# Patient Record
Sex: Male | Born: 2000 | Hispanic: No | Marital: Single | Smoking: Never smoker
Health system: Southern US, Community
[De-identification: ages and names within clinical notes are randomized; demographics above are authoritative.]

## PROBLEM LIST (undated history)

## (undated) DIAGNOSIS — R519 Headache, unspecified: Secondary | ICD-10-CM

## (undated) DIAGNOSIS — F909 Attention-deficit hyperactivity disorder, unspecified type: Secondary | ICD-10-CM

## (undated) DIAGNOSIS — R011 Cardiac murmur, unspecified: Secondary | ICD-10-CM

## (undated) HISTORY — PX: TYMPANOSTOMY TUBE PLACEMENT: SHX32

---
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
## 2021-09-08 ENCOUNTER — Ambulatory Visit: Payer: Self-pay

## 2021-09-08 ENCOUNTER — Other Ambulatory Visit: Payer: Self-pay

## 2021-09-08 ENCOUNTER — Encounter: Payer: Self-pay | Admitting: Orthopaedic Surgery

## 2021-09-08 ENCOUNTER — Ambulatory Visit (INDEPENDENT_AMBULATORY_CARE_PROVIDER_SITE_OTHER): Payer: Medicaid Other | Admitting: Orthopaedic Surgery

## 2021-09-08 ENCOUNTER — Ambulatory Visit (INDEPENDENT_AMBULATORY_CARE_PROVIDER_SITE_OTHER): Payer: Medicaid Other

## 2021-09-08 ENCOUNTER — Telehealth: Payer: Self-pay | Admitting: Radiology

## 2021-09-08 VITALS — Ht 68.0 in | Wt 140.0 lb

## 2021-09-08 DIAGNOSIS — S83001A Unspecified subluxation of right patella, initial encounter: Secondary | ICD-10-CM

## 2021-09-08 DIAGNOSIS — Q741 Congenital malformation of knee: Secondary | ICD-10-CM | POA: Diagnosis not present

## 2021-09-08 DIAGNOSIS — M25561 Pain in right knee: Secondary | ICD-10-CM

## 2021-09-08 DIAGNOSIS — Z0189 Encounter for other specified special examinations: Secondary | ICD-10-CM

## 2021-09-08 NOTE — Addendum Note (Signed)
Addended by: Rogers Seeds on: 09/08/2021 04:26 PM   Modules accepted: Orders

## 2021-09-08 NOTE — Progress Notes (Signed)
Office Visit Note   Patient: Brad Arnold           Date of Birth: 09-Feb-2001           MRN: [id number] Visit Date: 09/08/2021              Requested by: [facility] [address] [place],  [place] [address] PCP: [facility]   Assessment & Plan: Visit Diagnoses:  1. Acute pain of right knee   2. Patellar subluxation, right, initial encounter   3. Bipartite patella     Plan: Patient with right knee patellar subluxation relocation.  He has positive apprehension on his opposite left knee and needs to go through quadriceps rehab program.  He can continue his knee immobilizer and use his crutches gradually wean off of this as he progresses with physical therapy.  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  Work slip given no work at [employer] firearms x4 weeks.  No drill work x4 weeks.  Recheck 4 weeks.  Follow-Up Instructions: No follow-ups on file.   Orders:  Orders Placed This Encounter  Procedures   XR KNEE 3 VIEW RIGHT   No orders of the defined types were placed in this encounter.     Procedures: No procedures performed   Clinical Data: No additional findings.   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  He was able to stand up had pain and swelling which is resolved to some degree.  He still has pain along the medial aspect of the patella and has apprehension if pressure is applied to the patella.  No past history of knee dislocation or patellar dislocation or knee subluxation.  He is currently in therapy at [facility] outpatient therapy for  right shoulder latissimus injury which is not Worker's Comp. related.  Patient was placed in a knee immobilizer and crutches and states he could not put any pressure on his knee immediately.  He has been using ice swelling is gone down and is also used Aleve which is helped.  Denies problems with opposite left knee.  Review of Systems all systems are noncontributory to HPI.   Objective: Vital Signs: Ht 5\' 8"  (1.727 m)    Wt 140 lb (63.5 kg)    BMI 21.29 kg/m   Physical Exam Constitutional:      Appearance: He is well-developed.  HENT:     Head: Normocephalic and atraumatic.     Right Ear: External ear normal.     Left Ear: External ear normal.  Eyes:     Pupils: Pupils are equal, round, and reactive to light.  Neck:     Thyroid: No thyromegaly.     Trachea: No tracheal deviation.  Cardiovascular:     Rate and Rhythm: Normal rate.  Pulmonary:     Effort: Pulmonary effort is normal.     Breath sounds: No wheezing.  Abdominal:     General: Bowel sounds are normal.     Palpations: Abdomen is soft.  Musculoskeletal:     Cervical back: Neck supple.  Skin:    General: Skin is warm and dry.     Capillary Refill: Capillary refill takes less than 2 seconds.  Neurological:     Mental Status: He is alert and oriented to person, place, and time.  Psychiatric:        Behavior: Behavior normal.        Thought Content: Thought content normal.        Judgment: Judgment normal.    Ortho Exam negative logroll the hips pulses are normal trace right knee effusion bilateral positive apprehension test with lateral patellar subluxation.  Patient is acutely tender over the right knee medial retinaculum.  Specialty Comments:  No specialty comments available.  Imaging: XR Knee 1-2 Views Left  Result Date: 09/08/2021 2 view x-ray left knee including a sunrise patella and an AP x-rays obtained for comparison to his opposite knee bipartite patella.  Left knee patella shows normal formation  normal position and alignment. Impression: Left knee normal patella.  XR KNEE 3 VIEW RIGHT  Result Date: 09/08/2021 Three-view x-rays right knee obtained and reviewed this shows bipartite patella with a smooth sclerotic junction with no acute injury.  Q angle is normal.  No acute fracture.  No knee effusion. Impression:  Right Knee Bipartite patella.  No evulsion fracture post patellar subluxation with normal medial patellar border.    PMFS History: Patient Active Problem List   Diagnosis Date Noted   Patellar subluxation, right, initial encounter 09/08/2021   Bipartite patella 09/08/2021    No past medical history on file.  Family History  Problem Relation Age of Onset   Diabetes Other    Stroke Other    Cancer Other    Hypertension Other     No past surgical history on file. Social History   Occupational History   Not on file  Tobacco Use   Smoking status: Never   Smokeless tobacco: Never  Substance and Sexual Activity   Alcohol use: No   Drug use: No   Sexual activity: Not on file

## 2021-09-08 NOTE — Telephone Encounter (Signed)
Patient's mom came in for appointment. 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   FYI

## 2021-09-08 NOTE — Telephone Encounter (Signed)
Patient is being seen in the [place] office today. 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 He was told by someone there that he would need to have [place] bill him and then he is to take the bill to them to handle. I did not know what he would need to do or if it is just filed as no insurance?  Sorry..this was out of my realm.  Could you please call him and let him know what he needs to do?

## 2021-09-14 NOTE — Telephone Encounter (Signed)
noted 

## 2021-10-06 ENCOUNTER — Other Ambulatory Visit: Payer: Self-pay

## 2021-10-06 ENCOUNTER — Ambulatory Visit (INDEPENDENT_AMBULATORY_CARE_PROVIDER_SITE_OTHER): Payer: Medicaid Other | Admitting: Orthopaedic Surgery

## 2021-10-06 ENCOUNTER — Encounter: Payer: Self-pay | Admitting: Orthopaedic Surgery

## 2021-10-06 VITALS — Ht 68.0 in | Wt 140.0 lb

## 2021-10-06 DIAGNOSIS — S83001A Unspecified subluxation of right patella, initial encounter: Secondary | ICD-10-CM | POA: Diagnosis not present

## 2021-10-06 NOTE — Progress Notes (Signed)
Office Visit Note   Patient: Brad Arnold           Date of Birth: [birth year]           MRN: [id number] Visit Date: 10/06/2021              Requested by: [facility] [address] [place],  [place] [address] PCP: [facility]   Assessment & Plan: Visit Diagnoses:  1. Patellar subluxation, right, initial encounter            Subsequent encounter.  Plan: Patient can work on straight leg raising at home with the either ankle weights that he already has or book bag or rucksack over his ankle.  We are still waiting on some therapy to start and if he does not respond to therapy then MRI scan of his knee may be needed.  Patient not ready to resume his normal job where he standing and work slip given no work x4 weeks.  He will call if he feels that he is made progress enough that he could resume work and needs an updated note.  Otherwise recheck 4 weeks.  Follow-Up Instructions: Return in about 4 weeks (around 11/03/2021).   Orders:  No orders of the defined types were placed in this encounter.  No orders of the defined types were placed in this encounter.     Procedures: No procedures performed   Clinical Data: No additional findings.   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  Patient's been in a knee immobilizer and we have been waiting on physical therapy to start.  He has been going to therapy at [facility] outpatient therapy for his shoulder which was not related to his knee injury and as a separate injury.  Patient's been in a knee immobilizer has been using crutches.  He removes it when he showers.  He has used some intermittent Aleve.  Review of Systems all other systems updated  unchanged.   Objective: Vital Signs: Ht 5\' 8"  (1.727 m)    Wt 140 lb (63.5 kg)    BMI 21.29 kg/m   Physical Exam Constitutional:      Appearance: He is well-developed.  HENT:     Head: Normocephalic and atraumatic.     Right Ear: External ear normal.     Left Ear: External ear normal.  Eyes:     Pupils: Pupils are equal, round, and reactive to light.  Neck:     Thyroid: No thyromegaly.     Trachea: No tracheal deviation.  Cardiovascular:     Rate and Rhythm: Normal rate.  Pulmonary:     Effort: Pulmonary effort is normal.     Breath sounds: No wheezing.  Abdominal:     General: Bowel sounds are normal.     Palpations: Abdomen is soft.  Musculoskeletal:     Cervical back: Neck supple.  Skin:    General: Skin is warm and dry.     Capillary Refill: Capillary refill takes less than 2 seconds.  Neurological:     Mental Status: He is alert and oriented to person, place, and time.  Psychiatric:        Behavior: Behavior normal.        Thought  Content: Thought content normal.        Judgment: Judgment normal.    Ortho Exam the swelling is down.  Apprehension with patellar subluxation.  Specialty Comments:  No specialty comments available.  Imaging: No results found.   PMFS History: Patient Active Problem List   Diagnosis Date Noted   Patellar subluxation, right, initial encounter 09/08/2021   Bipartite patella 09/08/2021   No past medical history on file.  Family History  Problem Relation Age of Onset   Diabetes Other    Stroke Other    Cancer Other    Hypertension Other     No past surgical history on file. Social History   Occupational History   Not on file  Tobacco Use   Smoking status: Never   Smokeless tobacco: Never  Substance and Sexual Activity   Alcohol use: No   Drug use: No   Sexual activity: Not on file

## 2021-10-13 ENCOUNTER — Telehealth: Payer: Self-pay | Admitting: Radiology

## 2021-10-13 NOTE — Telephone Encounter (Signed)
Received call asking if it is okay for patient to drive? Please advise.  CB [phone]

## 2021-10-13 NOTE — Telephone Encounter (Signed)
I called patient's mom and advised. She states that they have not started PT yet due to patient running into some difficulty with the service. 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 She will attempt to get [facility] info for [place] then so that we can get the approval for PT, etc. She will call back with questions/problems.

## 2021-11-02 ENCOUNTER — Telehealth: Payer: Self-pay | Admitting: Orthopaedic Surgery

## 2021-11-02 DIAGNOSIS — S83001A Unspecified subluxation of right patella, initial encounter: Secondary | ICD-10-CM

## 2021-11-02 NOTE — Telephone Encounter (Signed)
Please see message from [place] office below and advise. Patient has not had PT because he was waiting on Work Comp approval. He apparently is now filing his regular insurance and requests MRI.  Patient:  Brad Arnold  MRN #: [id number]  Phone #:  [phone]     His Mom called and states he wants to get ahead and get an MRI for his right knee.  She also said he only has Medicaid at this time.

## 2021-11-02 NOTE — Telephone Encounter (Signed)
I called, number not in service.

## 2021-11-02 NOTE — Telephone Encounter (Signed)
Patient 's mother veronica called advised PT is not going to be covered. They will have to pay for everything out of pocket. Suzette Battiest said patient need to get something done about her sons knee. Veronica asked for a call back as soon as possible. Please see previous note.  The number to contact Suzette Battiest is [phone]

## 2021-11-02 NOTE — Telephone Encounter (Signed)
Noted. I have tried to reach mom and patient with no luck. Sent message to [place] in [place] office to see if she could try and reach them from [place] phone number. Will try again.

## 2021-11-03 ENCOUNTER — Ambulatory Visit: Payer: Medicaid Other | Admitting: Orthopaedic Surgery

## 2021-11-03 ENCOUNTER — Other Ambulatory Visit: Payer: Self-pay

## 2021-11-03 NOTE — Telephone Encounter (Signed)
Patient has decided to file regular insurance instead of Work Comp. He is going to start PT for his knee and requests another out of work note as he has not been able to start it yet. Please advise. OK for note? For how long?

## 2021-11-03 NOTE — Telephone Encounter (Signed)
Per Dr. Ophelia Charter, ok for out of work x 4 weeks. Patient may return to work if he feels better sooner.

## 2021-11-03 NOTE — Telephone Encounter (Signed)
Referral entered for [facility] PT.  Patient's mom is aware and will have patient schedule. They are going to file with Medicaid and not go through [facility].   [place] office to send referral.  Office visit scheduled for 11/03/2021 was canceled due to patient not attending PT yet.   Tisha--FYI. Patient is not filing WC.

## 2021-11-11 ENCOUNTER — Telehealth: Payer: Self-pay

## 2021-11-11 NOTE — Telephone Encounter (Signed)
PT with [facility] called stating that he is needing clarification on physical therapy due to patient telling him that his right patella is in 2 pieces, but he is not sure if this is true.  Would like a CB at [phone].  Please advise.  Thank you.

## 2021-11-14 NOTE — Telephone Encounter (Signed)
I called, [name], PT who needs the info is not working there today. Lupita Leash asked that I call back tomorrow to speak with Jonny Ruiz and advise. Patient has bipartite patella on right.

## 2021-11-16 NOTE — Telephone Encounter (Signed)
I called [name] and advised. ?

## 2021-11-23 ENCOUNTER — Telehealth: Payer: Self-pay | Admitting: Radiology

## 2021-11-23 NOTE — Telephone Encounter (Signed)
Please see message from [place] office below and advise. OK for out of work another 6 weeks? ? ? ?[place], Carlon Cori Razor, [place], RT ?Pt has  12 visits of PT left and is ging twice weekly.. Can he get a work note to cover that time?  ?

## 2021-11-24 NOTE — Telephone Encounter (Signed)
Note entered. Placed at front desk in [place] for patient to pick up. ?

## 2022-02-13 ENCOUNTER — Telehealth: Payer: Self-pay | Admitting: Radiology

## 2022-02-13 DIAGNOSIS — S83001A Unspecified subluxation of right patella, initial encounter: Secondary | ICD-10-CM

## 2022-02-13 NOTE — Addendum Note (Signed)
Addended by: Meyer Cory on: 02/13/2022 12:58 PM   Modules accepted: Orders

## 2022-02-13 NOTE — Telephone Encounter (Signed)
Please see message from [place] office below and advise. Would you like for me to order MRI?  Please call about MRI.....???  Pt's mother thinks they are waiting for it to be scheduled since he has been thru 2 rounds of PT.  [phone]

## 2022-02-13 NOTE — Telephone Encounter (Signed)
Patient never followed up after PT. MRI order entered per OK by Dr. Lorin Mercy. I called [place] in [place] office. She will precert and schedule.

## 2022-03-23 ENCOUNTER — Encounter: Payer: Self-pay | Admitting: Orthopaedic Surgery

## 2022-03-23 ENCOUNTER — Ambulatory Visit (INDEPENDENT_AMBULATORY_CARE_PROVIDER_SITE_OTHER): Payer: Medicaid Other | Admitting: Orthopaedic Surgery

## 2022-03-23 VITALS — Ht 68.0 in | Wt 135.0 lb

## 2022-03-23 DIAGNOSIS — S83001A Unspecified subluxation of right patella, initial encounter: Secondary | ICD-10-CM

## 2022-03-27 NOTE — Progress Notes (Signed)
Office Visit Note   Patient: Brad Arnold           Date of Birth: 2001/06/05           MRN: [id number] Visit Date: 03/23/2022 what              Requested by: [facility] [address] [place],  [place] [address] PCP: [facility]   Assessment & Plan: Visit Diagnoses:  1. Patellar subluxation, right, initial encounter     Plan: Patient has shallow groove and lateral facet is somewhat atrophic increasing his right knee subluxation problem.  Incidental bipartite type patella.  Plan would be patellar realignment procedure since he has had recurrent chronic problems and has failed conservative treatment.  Plan would be knee arthroscopy with arthroscopic cartilage debridement as needed, arthroscopic lateral release.  Open medial VMO plication and distal one half patellar tendon transfer.  It would plate patient likely 4 to 6 months before he is back to normal activity.  Procedure discussed risk surgery discussed extensive therapy postop discussed.  Questions elicited and answered patient and father request we proceed.  Plan attachment distal one half patellar tendon with Arthrex screw into the tibia.  Follow-Up Instructions: No follow-ups on file.   Orders:  No orders of the defined types were placed in this encounter.  No orders of the defined types were placed in this encounter.     Procedures: No procedures performed   Clinical Data: No additional findings.   Subjective: Chief Complaint  Patient presents with   Right Knee - Pain    MRI Results    HPI 21 year old male returns with ongoing problems with recurrent right patellar subluxation relocation.  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  He has not been able to resume normal activity due to patellofemoral instability and has been treated with knee immobilizer, crutches, extensive physical therapy.  Patient has  shallow patellofemoral groove.  Knee MRI was done 03/08/2022 is available for review and father is present today for discussion.  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  Review of Systems   Objective: Vital Signs: Ht 5\' 8"  (1.727 m)   Wt 135 lb (61.2 kg)   BMI 20.53 kg/m   Physical Exam Constitutional:      Appearance: He is well-developed.  HENT:     Head: Normocephalic and atraumatic.     Right Ear: External ear normal.     Left Ear: External ear normal.  Eyes:     Pupils: Pupils are equal, round, and reactive to light.  Neck:     Thyroid: No thyromegaly.     Trachea: No tracheal deviation.  Cardiovascular:     Rate and Rhythm: Normal rate.  Pulmonary:     Effort: Pulmonary effort is normal.     Breath sounds: No wheezing.  Abdominal:     General: Bowel sounds are normal.     Palpations: Abdomen is soft.  Musculoskeletal:     Cervical back: Neck supple.  Skin:    General: Skin is warm and dry.     Capillary Refill: Capillary refill takes less than 2 seconds.  Neurological:     Mental Status: He is alert and oriented to person, place, and time.  Psychiatric:        Behavior: Behavior normal.  Thought Content: Thought content normal.        Judgment: Judgment normal.     Ortho Exam reflexes are intact.  Patient is amatory without limping.  With supine position and relaxation he has a positive patellar apprehension.  Lateral retinaculum is tight and he has medial laxity and I can sublux his patella with sharp acute pain.  Some increased Q angle on radiographs.  Knee and ankle jerk are intact good abductor quad strength sensation over the anterior thighs intact.  Specialty Comments:  No specialty comments available.  Imaging: Impression  Very mild marrow edema in the lateral femoral trochlea is compatible  with late subacute to remote patellar dislocation. The extensor  mechanism of the knee, including the  medial retinacular structures,  appears intact.   Lateral subluxation of the patella, abnormal TT-TG distance, shallow  trochlear groove and diminutive medial patellar facet predispose to  dislocation.   Bipartite patella with marrow edema about the synchondrosis which  may be due to motion.    Electronically Signed    By: Drusilla Kanner M.D.    On: 03/10/2022 08:56 Narrative  CLINICAL DATA:  The patient suffered a patellar dislocation of the  right knee kicking a ball in late 2022.   EXAM:  MRI OF THE LOWER RIGHT JOINT WITHOUT CONTRAST   TECHNIQUE:  Multiplanar, multisequence MR imaging of the knee was performed. No  intravenous contrast was administered.   COMPARISON:  Plain films right knee 12/scratch the plain films right  knee 04/12/2018   FINDINGS:  MENISCI   Medial meniscus:  Intact.   Lateral meniscus:  Intact.   LIGAMENTS   Cruciates:  Intact.   Collaterals:  Intact.   CARTILAGE   Patellofemoral:  Preserved.   Medial:  Preserved.   Lateral:  Preserved.   Joint:  Small joint effusion.   Popliteal Fossa:  No Baker's cyst.   Extensor Mechanism: The extensor mechanism of the knee, including  the medial retinacular structures, is intact. The patient has a  bipartite patella with marrow edema about the synchondrosis. The  unfused moiety is laterally positioned off of the femoral trochlea  and there is lateral tilt of the patella. TT-TG distance is abnormal  at 2.1 cm. The trochlear groove is shallow and the medial patellar  facet is flattened. Minimal marrow edema is seen in the lateral  femoral trochlea.   Bones:  As above.  Otherwise negative.   Other: None. Procedure Note  Madaline Guthrie, MD - 03/10/2022  Formatting of this note might be different from the original.  CLINICAL DATA:  The patient suffered a patellar dislocation of the  right knee kicking a ball in late 2022.   EXAM:  MRI OF THE LOWER RIGHT JOINT WITHOUT CONTRAST    TECHNIQUE:  Multiplanar, multisequence MR imaging of the knee was performed. No  intravenous contrast was administered.   COMPARISON:  Plain films right knee 12/scratch the plain films right  knee 04/12/2018   FINDINGS:  MENISCI   Medial meniscus:  Intact.   Lateral meniscus:  Intact.   LIGAMENTS   Cruciates:  Intact.   Collaterals:  Intact.   CARTILAGE   Patellofemoral:  Preserved.   Medial:  Preserved.   Lateral:  Preserved.   Joint:  Small joint effusion.   Popliteal Fossa:  No Baker's cyst.   Extensor Mechanism: The extensor mechanism of the knee, including  the medial retinacular structures, is intact. The patient has a  bipartite patella with marrow  edema about the synchondrosis. The  unfused moiety is laterally positioned off of the femoral trochlea  and there is lateral tilt of the patella. TT-TG distance is abnormal  at 2.1 cm. The trochlear groove is shallow and the medial patellar  facet is flattened. Minimal marrow edema is seen in the lateral  femoral trochlea.   Bones:  As above.  Otherwise negative.   Other: None.   IMPRESSION:  Very mild marrow edema in the lateral femoral trochlea is compatible  with late subacute to remote patellar dislocation. The extensor  mechanism of the knee, including the medial retinacular structures,  appears intact.   Lateral subluxation of the patella, abnormal TT-TG distance, shallow  trochlear groove and diminutive medial patellar facet predispose to  dislocation.   Bipartite patella with marrow edema about the synchondrosis which  may be due to motion.    Electronically Signed    By: Drusilla Kanner M.D.    On: 03/10/2022 08:56 Exam End: 03/08/22 14:35   Specimen Collected: 03/10/22 08:49 Last Resulted: 03/10/22 08:56  Received From: [facility]  Result Received: 03/23/22 08:46     PMFS History: Patient Active Problem List   Diagnosis Date Noted   Patellar subluxation, right, initial  encounter 09/08/2021   Bipartite patella 09/08/2021   No past medical history on file.  Family History  Problem Relation Age of Onset   Diabetes Other    Stroke Other    Cancer Other    Hypertension Other     No past surgical history on file. Social History   Occupational History   Not on file  Tobacco Use   Smoking status: Never   Smokeless tobacco: Never  Substance and Sexual Activity   Alcohol use: No   Drug use: No   Sexual activity: Not on file

## 2022-04-05 ENCOUNTER — Encounter: Payer: Self-pay | Admitting: Surgery

## 2022-04-05 ENCOUNTER — Ambulatory Visit (INDEPENDENT_AMBULATORY_CARE_PROVIDER_SITE_OTHER): Payer: Medicaid Other | Admitting: Surgery

## 2022-04-05 VITALS — BP 102/63 | HR 64 | Ht 68.0 in | Wt 135.0 lb

## 2022-04-05 DIAGNOSIS — S83001A Unspecified subluxation of right patella, initial encounter: Secondary | ICD-10-CM

## 2022-04-05 NOTE — Progress Notes (Signed)
21 year old male with history of chronic right patella subluxation comes in for prep evaluation.  States that symptoms unchanged from previous visit.  He is wanting to proceed with RIGHT KNEE ARTHROSCOPY WITH LATERAL RELEASE, VMO PLICATION, 1/2 PATELLA TENDON TRANSFER as scheduled.  Today history and physical performed.  Review of systems positive for occasional episodes of chest pain.  States that this may occur every 1 to 2 months can be without exertion.  These episodes are short-lived.  Patient does have a primary care provider.  Patient and family member state that they are familiar with what to expect from surgery since Dr. Ophelia Charter has explained it to him previously.  Advised that he will likely be in a knee immobilizer for at least 6 weeks postop.  All questions answered.  Advised that I will be getting a preop EKG.

## 2022-04-13 ENCOUNTER — Encounter (HOSPITAL_COMMUNITY): Payer: Self-pay | Admitting: Orthopaedic Surgery

## 2022-04-17 ENCOUNTER — Ambulatory Visit (HOSPITAL_COMMUNITY): Admission: RE | Admit: 2022-04-17 | Payer: Medicaid Other | Source: Home / Self Care | Admitting: Orthopaedic Surgery

## 2022-04-17 SURGERY — KNEE ARTHROSCOPY WITH PATELLAR TENDON REPAIR
Anesthesia: General | Site: Knee | Laterality: Right

## 2022-04-27 ENCOUNTER — Encounter: Payer: Medicaid Other | Admitting: Orthopaedic Surgery

## 2022-05-02 ENCOUNTER — Encounter (HOSPITAL_COMMUNITY): Payer: Self-pay | Admitting: Orthopaedic Surgery

## 2022-05-02 ENCOUNTER — Other Ambulatory Visit: Payer: Self-pay

## 2022-05-02 NOTE — Progress Notes (Signed)
I spoke to Brad Arnold's mother Brad Arnold- the cell phone for patient is his mother's number.  Brad Arnold denies having any s/s of Covid in her household.  Patient denies any known exposure to Covid.   Brad Arnold's PCP is with [facility].

## 2022-05-03 ENCOUNTER — Ambulatory Visit (HOSPITAL_BASED_OUTPATIENT_CLINIC_OR_DEPARTMENT_OTHER): Payer: Medicaid Other | Admitting: Certified Registered Nurse Anesthetist

## 2022-05-03 ENCOUNTER — Other Ambulatory Visit: Payer: Self-pay

## 2022-05-03 ENCOUNTER — Encounter (HOSPITAL_COMMUNITY): Payer: Self-pay | Admitting: Orthopaedic Surgery

## 2022-05-03 ENCOUNTER — Ambulatory Visit (HOSPITAL_COMMUNITY): Payer: Medicaid Other | Admitting: Certified Registered Nurse Anesthetist

## 2022-05-03 ENCOUNTER — Encounter (HOSPITAL_COMMUNITY)
Admission: RE | Disposition: A | Discharge status: Home / Self Care | Payer: Self-pay | Source: Home / Self Care | Attending: Orthopaedic Surgery

## 2022-05-03 ENCOUNTER — Observation Stay (HOSPITAL_COMMUNITY)
Admission: RE | Admit: 2022-05-03 | Discharge: 2022-05-04 | Disposition: A | Discharge status: Home / Self Care | Payer: Medicaid Other | Attending: Orthopaedic Surgery | Admitting: Orthopaedic Surgery

## 2022-05-03 DIAGNOSIS — M2211 Recurrent subluxation of patella, right knee: Principal | ICD-10-CM | POA: Insufficient documentation

## 2022-05-03 DIAGNOSIS — S83014A Lateral dislocation of right patella, initial encounter: Secondary | ICD-10-CM

## 2022-05-03 DIAGNOSIS — Z01818 Encounter for other preprocedural examination: Secondary | ICD-10-CM

## 2022-05-03 DIAGNOSIS — Z79899 Other long term (current) drug therapy: Secondary | ICD-10-CM | POA: Insufficient documentation

## 2022-05-03 DIAGNOSIS — S83011A Lateral subluxation of right patella, initial encounter: Secondary | ICD-10-CM | POA: Diagnosis not present

## 2022-05-03 DIAGNOSIS — M25361 Other instability, right knee: Secondary | ICD-10-CM | POA: Diagnosis not present

## 2022-05-03 HISTORY — PX: KNEE ARTHROSCOPY WITH PATELLAR TENDON REPAIR: SHX5656

## 2022-05-03 HISTORY — DX: Headache, unspecified: R51.9

## 2022-05-03 HISTORY — DX: Cardiac murmur, unspecified: R01.1

## 2022-05-03 HISTORY — DX: Attention-deficit hyperactivity disorder, unspecified type: F90.9

## 2022-05-03 LAB — CBC
HCT: 45.2 % (ref 39.0–52.0)
Hemoglobin: 15.2 g/dL (ref 13.0–17.0)
MCH: 31.2 pg (ref 26.0–34.0)
MCHC: 33.6 g/dL (ref 30.0–36.0)
MCV: 92.8 fL (ref 80.0–100.0)
Platelets: 203 10*3/uL (ref 150–400)
RBC: 4.87 MIL/uL (ref 4.22–5.81)
RDW: 11.9 % (ref 11.5–15.5)
WBC: 5.3 10*3/uL (ref 4.0–10.5)
nRBC: 0 % (ref 0.0–0.2)

## 2022-05-03 SURGERY — KNEE ARTHROSCOPY WITH PATELLAR TENDON REPAIR
Anesthesia: General | Site: Knee | Laterality: Right

## 2022-05-03 MED ORDER — CHLORHEXIDINE GLUCONATE 0.12 % MT SOLN
OROMUCOSAL | Status: AC
  Filled 2022-05-03: qty 15

## 2022-05-03 MED ORDER — CEFAZOLIN SODIUM-DEXTROSE 2-4 GM/100ML-% IV SOLN
INTRAVENOUS | Status: AC
  Filled 2022-05-03: qty 100

## 2022-05-03 MED ORDER — DEXAMETHASONE SODIUM PHOSPHATE 10 MG/ML IJ SOLN
INTRAMUSCULAR | Status: DC | PRN
  Administered 2022-05-03: 10 mg via INTRAVENOUS

## 2022-05-03 MED ORDER — CHLORHEXIDINE GLUCONATE 0.12 % MT SOLN
15.0000 mL | OROMUCOSAL | Status: AC
  Filled 2022-05-03: qty 15

## 2022-05-03 MED ORDER — CHLORHEXIDINE GLUCONATE 0.12 % MT SOLN
OROMUCOSAL | Status: AC
  Administered 2022-05-03: 15 mL via OROMUCOSAL
  Filled 2022-05-03: qty 15

## 2022-05-03 MED ORDER — TRANEXAMIC ACID-NACL 1000-0.7 MG/100ML-% IV SOLN
INTRAVENOUS | Status: AC
  Filled 2022-05-03: qty 100

## 2022-05-03 MED ORDER — HYDROMORPHONE HCL 1 MG/ML IJ SOLN
0.5000 mg | INTRAMUSCULAR | Status: DC | PRN
  Administered 2022-05-03 – 2022-05-04 (×3): 0.5 mg via INTRAVENOUS
  Filled 2022-05-03 (×2): qty 0.5

## 2022-05-03 MED ORDER — MIDAZOLAM HCL 2 MG/2ML IJ SOLN
INTRAMUSCULAR | Status: AC
  Filled 2022-05-03: qty 2

## 2022-05-03 MED ORDER — AMPHETAMINE-DEXTROAMPHETAMINE 10 MG PO TABS: 10.0000 mg | ORAL_TABLET | Freq: Every day | ORAL | Status: DC

## 2022-05-03 MED ORDER — PROPOFOL 500 MG/50ML IV EMUL
INTRAVENOUS | Status: DC | PRN
  Administered 2022-05-03: 50 ug/kg/min via INTRAVENOUS

## 2022-05-03 MED ORDER — SODIUM CHLORIDE 0.9 % IV SOLN: INTRAVENOUS | Status: DC

## 2022-05-03 MED ORDER — FENTANYL CITRATE (PF) 250 MCG/5ML IJ SOLN
INTRAMUSCULAR | Status: AC
  Filled 2022-05-03: qty 5

## 2022-05-03 MED ORDER — LIDOCAINE 2% (20 MG/ML) 5 ML SYRINGE
INTRAMUSCULAR | Status: DC | PRN
  Administered 2022-05-03: 100 mg via INTRAVENOUS

## 2022-05-03 MED ORDER — ACETAMINOPHEN 325 MG PO TABS
325.0000 mg | ORAL_TABLET | Freq: Four times a day (QID) | ORAL | Status: DC | PRN
  Administered 2022-05-04: 650 mg via ORAL
  Filled 2022-05-03: qty 2

## 2022-05-03 MED ORDER — CEFAZOLIN SODIUM-DEXTROSE 2-4 GM/100ML-% IV SOLN
2.0000 g | INTRAVENOUS | Status: AC
  Administered 2022-05-03: 2 g via INTRAVENOUS
  Filled 2022-05-03: qty 100

## 2022-05-03 MED ORDER — METOCLOPRAMIDE HCL 5 MG/ML IJ SOLN: 5.0000 mg | Freq: Three times a day (TID) | INTRAMUSCULAR | Status: DC | PRN

## 2022-05-03 MED ORDER — LIDOCAINE 2% (20 MG/ML) 5 ML SYRINGE
INTRAMUSCULAR | Status: AC
  Filled 2022-05-03: qty 5

## 2022-05-03 MED ORDER — ONDANSETRON HCL 4 MG/2ML IJ SOLN
INTRAMUSCULAR | Status: AC
  Filled 2022-05-03: qty 2

## 2022-05-03 MED ORDER — FENTANYL CITRATE (PF) 100 MCG/2ML IJ SOLN
INTRAMUSCULAR | Status: AC
  Filled 2022-05-03: qty 2

## 2022-05-03 MED ORDER — POLYETHYLENE GLYCOL 3350 17 G PO PACK: 17.0000 g | PACK | Freq: Every day | ORAL | Status: DC | PRN

## 2022-05-03 MED ORDER — FENTANYL CITRATE (PF) 100 MCG/2ML IJ SOLN
25.0000 ug | INTRAMUSCULAR | Status: DC | PRN
  Administered 2022-05-03 (×4): 25 ug via INTRAVENOUS

## 2022-05-03 MED ORDER — DOCUSATE SODIUM 100 MG PO CAPS
100.0000 mg | ORAL_CAPSULE | Freq: Two times a day (BID) | ORAL | Status: DC
  Administered 2022-05-03 – 2022-05-04 (×2): 100 mg via ORAL
  Filled 2022-05-03 (×2): qty 1

## 2022-05-03 MED ORDER — HYDROMORPHONE HCL 1 MG/ML IJ SOLN
INTRAMUSCULAR | Status: AC
  Filled 2022-05-03: qty 1

## 2022-05-03 MED ORDER — ONDANSETRON HCL 4 MG PO TABS: 4.0000 mg | ORAL_TABLET | Freq: Four times a day (QID) | ORAL | Status: DC | PRN

## 2022-05-03 MED ORDER — METHOCARBAMOL 500 MG PO TABS
500.0000 mg | ORAL_TABLET | Freq: Four times a day (QID) | ORAL | Status: DC | PRN
  Administered 2022-05-03 – 2022-05-04 (×3): 500 mg via ORAL
  Filled 2022-05-03 (×3): qty 1

## 2022-05-03 MED ORDER — ONDANSETRON HCL 4 MG/2ML IJ SOLN
INTRAMUSCULAR | Status: DC | PRN
  Administered 2022-05-03: 4 mg via INTRAVENOUS

## 2022-05-03 MED ORDER — ONDANSETRON HCL 4 MG/2ML IJ SOLN: 4.0000 mg | Freq: Four times a day (QID) | INTRAMUSCULAR | Status: DC | PRN

## 2022-05-03 MED ORDER — BUPIVACAINE-EPINEPHRINE 0.25% -1:200000 IJ SOLN
INTRAMUSCULAR | Status: DC | PRN
  Administered 2022-05-03: 30 mL

## 2022-05-03 MED ORDER — PHENYLEPHRINE 80 MCG/ML (10ML) SYRINGE FOR IV PUSH (FOR BLOOD PRESSURE SUPPORT)
PREFILLED_SYRINGE | INTRAVENOUS | Status: AC
  Filled 2022-05-03: qty 30

## 2022-05-03 MED ORDER — DEXAMETHASONE SODIUM PHOSPHATE 10 MG/ML IJ SOLN
INTRAMUSCULAR | Status: AC
  Filled 2022-05-03: qty 1

## 2022-05-03 MED ORDER — SODIUM CHLORIDE 0.9 % IR SOLN
Status: DC | PRN
  Administered 2022-05-03 (×2): 3000 mL

## 2022-05-03 MED ORDER — METHOCARBAMOL 1000 MG/10ML IJ SOLN: 500.0000 mg | Freq: Four times a day (QID) | INTRAVENOUS | Status: DC | PRN

## 2022-05-03 MED ORDER — ROCURONIUM BROMIDE 10 MG/ML (PF) SYRINGE
PREFILLED_SYRINGE | INTRAVENOUS | Status: AC
  Filled 2022-05-03: qty 10

## 2022-05-03 MED ORDER — VANCOMYCIN HCL IN DEXTROSE 1-5 GM/200ML-% IV SOLN
INTRAVENOUS | Status: AC
  Filled 2022-05-03: qty 200

## 2022-05-03 MED ORDER — FENTANYL CITRATE (PF) 250 MCG/5ML IJ SOLN
INTRAMUSCULAR | Status: DC | PRN
  Administered 2022-05-03: 100 ug via INTRAVENOUS

## 2022-05-03 MED ORDER — PROPOFOL 10 MG/ML IV BOLUS
INTRAVENOUS | Status: DC | PRN
  Administered 2022-05-03: 170 mg via INTRAVENOUS

## 2022-05-03 MED ORDER — PHENYLEPHRINE HCL-NACL 20-0.9 MG/250ML-% IV SOLN
INTRAVENOUS | Status: DC | PRN
  Administered 2022-05-03: 20 ug/min via INTRAVENOUS

## 2022-05-03 MED ORDER — LACTATED RINGERS IV SOLN: INTRAVENOUS | Status: DC

## 2022-05-03 MED ORDER — EPHEDRINE 5 MG/ML INJ
INTRAVENOUS | Status: AC
  Filled 2022-05-03: qty 5

## 2022-05-03 MED ORDER — MIDAZOLAM HCL 2 MG/2ML IJ SOLN
INTRAMUSCULAR | Status: DC | PRN
  Administered 2022-05-03: 2 mg via INTRAVENOUS

## 2022-05-03 MED ORDER — BUPIVACAINE-EPINEPHRINE (PF) 0.25% -1:200000 IJ SOLN
INTRAMUSCULAR | Status: AC
  Filled 2022-05-03: qty 30

## 2022-05-03 MED ORDER — METOCLOPRAMIDE HCL 5 MG PO TABS: 5.0000 mg | ORAL_TABLET | Freq: Three times a day (TID) | ORAL | Status: DC | PRN

## 2022-05-03 MED ORDER — OXYCODONE HCL 5 MG PO TABS
5.0000 mg | ORAL_TABLET | Freq: Four times a day (QID) | ORAL | Status: DC | PRN
  Administered 2022-05-03 – 2022-05-04 (×3): 5 mg via ORAL
  Filled 2022-05-03 (×3): qty 1

## 2022-05-03 MED ORDER — PROPOFOL 10 MG/ML IV BOLUS
INTRAVENOUS | Status: AC
  Filled 2022-05-03: qty 20

## 2022-05-03 SURGICAL SUPPLY — 48 items
ANCHOR SUT BIOCOMP CORKSREW (Anchor) ×1 IMPLANT
BAG COUNTER SPONGE SURGICOUNT (BAG) ×2 IMPLANT
BENZOIN TINCTURE PRP APPL 2/3 (GAUZE/BANDAGES/DRESSINGS) ×1 IMPLANT
BLADE CUDA 5.5 (BLADE) IMPLANT
BLADE EXCALIBUR 4.0X13 (MISCELLANEOUS) ×2 IMPLANT
BNDG ELASTIC 4X5.8 VLCR STR LF (GAUZE/BANDAGES/DRESSINGS) ×1 IMPLANT
BNDG ELASTIC 6X5.8 VLCR STR LF (GAUZE/BANDAGES/DRESSINGS) ×3 IMPLANT
BOOTCOVER CLEANROOM LRG (PROTECTIVE WEAR) ×4 IMPLANT
BUR OVAL 6.0 (BURR) IMPLANT
COVER SURGICAL LIGHT HANDLE (MISCELLANEOUS) ×2 IMPLANT
CUFF TOURN SGL QUICK 34 (TOURNIQUET CUFF) ×2
CUFF TOURN SGL QUICK 42 (TOURNIQUET CUFF) IMPLANT
CUFF TRNQT CYL 34X4.125X (TOURNIQUET CUFF) IMPLANT
DRAPE ARTHROSCOPY W/POUCH 114 (DRAPES) ×2 IMPLANT
DRSG PAD ABDOMINAL 8X10 ST (GAUZE/BANDAGES/DRESSINGS) ×3 IMPLANT
DURAPREP 26ML APPLICATOR (WOUND CARE) ×2 IMPLANT
GAUZE SPONGE 4X4 12PLY STRL (GAUZE/BANDAGES/DRESSINGS) ×3 IMPLANT
GAUZE XEROFORM 1X8 LF (GAUZE/BANDAGES/DRESSINGS) ×3 IMPLANT
GLOVE BIOGEL PI IND STRL 8 (GLOVE) ×1 IMPLANT
GLOVE BIOGEL PI INDICATOR 8 (GLOVE) ×2
GLOVE ORTHO TXT STRL SZ7.5 (GLOVE) ×2 IMPLANT
GOWN STRL REUS W/ TWL LRG LVL3 (GOWN DISPOSABLE) ×2 IMPLANT
GOWN STRL REUS W/TWL 2XL LVL3 (GOWN DISPOSABLE) IMPLANT
GOWN STRL REUS W/TWL LRG LVL3 (GOWN DISPOSABLE) ×4
IMMOBILIZER KNEE 22 UNIV (SOFTGOODS) ×1 IMPLANT
KIT BIO-TENODESIS 3X8 DISP (MISCELLANEOUS) ×2
KIT INSRT BABSR STRL DISP BTN (MISCELLANEOUS) IMPLANT
KIT TURNOVER KIT B (KITS) ×2 IMPLANT
NDL 1/2 CIR CATGUT .05X1.09 (NEEDLE) IMPLANT
NDL HYPO 25GX1X1/2 BEV (NEEDLE) IMPLANT
NEEDLE 1/2 CIR CATGUT .05X1.09 (NEEDLE) ×2 IMPLANT
NEEDLE HYPO 25GX1X1/2 BEV (NEEDLE) IMPLANT
PACK ARTHROSCOPY DSU (CUSTOM PROCEDURE TRAY) ×2 IMPLANT
PAD ARMBOARD 7.5X6 YLW CONV (MISCELLANEOUS) ×4 IMPLANT
PAD CAST 4YDX4 CTTN HI CHSV (CAST SUPPLIES) IMPLANT
PADDING CAST COTTON 4X4 STRL (CAST SUPPLIES) ×2
PADDING CAST COTTON 6X4 STRL (CAST SUPPLIES) ×2 IMPLANT
SPONGE T-LAP 4X18 ~~LOC~~+RFID (SPONGE) ×2 IMPLANT
STRIP CLOSURE SKIN 1/2X4 (GAUZE/BANDAGES/DRESSINGS) ×1 IMPLANT
SUT ETHIBOND 0 (SUTURE) ×1 IMPLANT
SUT ETHILON 3 0 FSL (SUTURE) ×1 IMPLANT
SUT FIBERWIRE #2 38 REV NDL BL (SUTURE) ×2
SUT VIC AB 2-0 CT1 (SUTURE) ×1 IMPLANT
SUT VIC AB 2-0 CT1 27 (SUTURE) ×2
SUT VIC AB 2-0 CT1 TAPERPNT 27 (SUTURE) IMPLANT
SUTURE FIBERWR#2 38 REV NDL BL (SUTURE) IMPLANT
TOWEL GREEN STERILE FF (TOWEL DISPOSABLE) ×4 IMPLANT
TUBING ARTHROSCOPY IRRIG 16FT (MISCELLANEOUS) ×2 IMPLANT

## 2022-05-03 NOTE — Progress Notes (Signed)
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 Pt takes colace which contains red dye. Pt states on this day  that he is okay with taking colace regardless of containing red dye, he also says that this allergy has been resolved and he does not suffer from allergic reactions that contains red dye, parents were present at the time of pt's statement- pt's mother said that he used to suffer from red dye allergic reactions when he was a small child but she does not know if he is still "allergic" to it  . Viviann Spare [facility] was notified.

## 2022-05-03 NOTE — Anesthesia Preprocedure Evaluation (Addendum)
Anesthesia Evaluation  Patient identified by MRN, date of birth, ID band Patient awake    Reviewed: Allergy & Precautions, NPO status , Patient's Chart, lab work & pertinent test results  Airway Mallampati: II  TM Distance: >3 FB     Dental   Pulmonary    breath sounds clear to auscultation       Cardiovascular (-) Valvular Problems/Murmurs Rhythm:Regular Rate:Normal  History noted Dr. Chilton Si   Neuro/Psych  Headaches,    GI/Hepatic negative GI ROS, Neg liver ROS,   Endo/Other  negative endocrine ROS  Renal/GU negative Renal ROS     Musculoskeletal   Abdominal   Peds  Hematology negative hematology ROS (+)   Anesthesia Other Findings   Reproductive/Obstetrics                            Anesthesia Physical Anesthesia Plan  ASA: 3  Anesthesia Plan: General   Post-op Pain Management:    Induction:   PONV Risk Score and Plan: 2  Airway Management Planned: LMA  Additional Equipment:   Intra-op Plan:   Post-operative Plan: Extubation in OR  Informed Consent: I have reviewed the patients History and Physical, chart, labs and discussed the procedure including the risks, benefits and alternatives for the proposed anesthesia with the patient or authorized representative who has indicated his/her understanding and acceptance.     Dental advisory given  Plan Discussed with: CRNA and Anesthesiologist  Anesthesia Plan Comments:         Anesthesia Quick Evaluation

## 2022-05-03 NOTE — Op Note (Signed)
Pre and postop diagnosis: Right knee recurrent patellar subluxation/dislocation.  Procedure: Right knee arthroscopy arthroscopic lateral release.  Medial plication and one half distal patellar tendon transfer medially.  (Roux Teacher, English as a foreign language)  Surgeon: Annell Greening, MD  Assistant: Zonia Kief, PA-C medically necessary and present for the entire procedure  Tourniquet: 300mg  times 49 minutes  Complications : none  Procedure: After induction of general anesthesia proximal tourniquet prepped with DuraPrep preoperative Ancef prophylaxis.  Prepped in the ankles usual arthroscopic sheets draped appropriate stockinette Coban was applied timeout procedure was completed.  Medial lateral parapatellar skin portals were used scope was introduced and the knee was inspected.  There was no significant severe chondral wear on the patella.  The trochlear groove was shallow with dysplastic medial lateral femoral condyles with easy lateral patellar dislocation with mild pressure on the medial aspect of the patella.  The lateral meniscus are intact.  Arthroscopic wand with hook probe was introduced and a lateral release was performed starting just inferior to the pole of the patella staying lateral and wide.  There is slight subcutaneous tissue to bulge out consistent with good relief no damage to the skin with complete release.  Knee was suctioned dry ACL PCL was normal.  Small C-shaped incision was made along the medial aspect of the patella and capsule was divided directly down into the synovium.  Double row sutures with 4 interrupted horizontal mattress sutures were placed with imbrication of the medial capsule shortening at 1 cm.  A second row sutures were placed for double row repair with 4 additional sutures.  These were all with #2 FiberWire.  After this was performed straight line incision was made directly over the patella.  Patella was split and then the lateral aspect was released off of the tibial  tubercle.  Lateral one half patella was passed underneath the medial one half make sure there is a wide window for easy passage.  Using 1/2 inch stem small osteoplastic flap was elevated and then pin driven distally and 5.5 corkscrew was inserted after drilling over reaming and then insertion of the corkscrew.  Posterior periosteal flap was placed over the top and then the #2 FiberWire sutures were passed in the bone now leave back up to the tendon securing it.  Additionally sutures were placed side-to-side suturing that one half transferred patellar tendon to the intact portion of the tendon.  2-0 Vicryl was used in subtenons tissue skin staple closure.  3-0 nylon in the arthroscopic portals postop dressing.  Total of 30 cc Marcaine was soft right postop dressing knee immobilizer and transferred recovery room stable condition.

## 2022-05-03 NOTE — H&P (Signed)
Brad Arnold is an 21 y.o. male.   Chief Complaint: Right knee patellofemoral stability HPI: 21 year old male with history of chronic right patella subluxation comes in for prep evaluation.  States that symptoms unchanged from previous visit.  He is wanting to proceed with RIGHT KNEE ARTHROSCOPY WITH LATERAL RELEASE, VMO PLICATION, 1/2 PATELLA TENDON TRANSFER as scheduled.  Today history and physical performed.  Review of systems positive for occasional episodes of chest pain.  States that this may occur every 1 to 2 months can be without exertion.  These episodes are short-lived.  Patient does have a primary care provider.   Past Medical History:  Diagnosis Date   ADHD (attention deficit hyperactivity disorder)    05/02/22- not on meds   Headache    Heart murmur    as a child    Past Surgical History:  Procedure Laterality Date   TYMPANOSTOMY TUBE PLACEMENT      Family History  Problem Relation Age of Onset   Diabetes Other    Stroke Other    Cancer Other    Hypertension Other    Social History:  reports that he has never smoked. He has never used smokeless tobacco. He reports that he does not drink alcohol and does not use drugs.  Allergies:  Allergies  Allergen Reactions   Red Dye Hives and Rash    Medications Prior to Admission  Medication Sig Dispense Refill   naproxen sodium (ALEVE) 220 MG tablet Take 440 mg by mouth 2 (two) times daily as needed (pain.).     amphetamine-dextroamphetamine (ADDERALL) 10 MG tablet Take by mouth.      Results for orders placed or performed during the hospital encounter of 05/03/22 (from the past 48 hour(s))  CBC     Status: None   Collection Time: 05/03/22 10:52 AM  Result Value Ref Range   WBC 5.3 4.0 - 10.5 K/uL   RBC 4.87 4.22 - 5.81 MIL/uL   Hemoglobin 15.2 13.0 - 17.0 g/dL   HCT 00.1 74.9 - 44.9 %   MCV 92.8 80.0 - 100.0 fL   MCH 31.2 26.0 - 34.0 pg   MCHC 33.6 30.0 - 36.0 g/dL   RDW 67.5 91.6 - 38.4 %   Platelets 203 150 -  400 K/uL   nRBC 0.0 0.0 - 0.2 %    Comment: Performed at [facility], [address], [place], [place] [address]   No results found.  Review of Systems  Constitutional:  Positive for activity change.  HENT: Negative.    Respiratory: Negative.    Cardiovascular:  Positive for chest pain (Exertional).  Gastrointestinal: Negative.     Blood pressure 112/82, pulse 64, temperature 98.6 F (37 C), temperature source Oral, resp. rate 18, height 5\' 8"  (1.727 m), weight 63.5 kg, SpO2 100 %. Physical Exam   Assessment/Plan Right knee patellofemoral stability  Patient and family member state that they are familiar with what to expect from surgery since Dr. has explained it to him previously.  Advised that he will likely be in a knee immobilizer for at least 6 weeks postop.  All questions answered.  Advised that I will be getting a preop EKG.  Ophelia Charter, PA-C 05/03/2022, 11:32 AM

## 2022-05-03 NOTE — Anesthesia Procedure Notes (Signed)
Procedure Name: LMA Insertion Date/Time: 05/03/2022 12:45 PM  Performed by: Darryl Nestle, CRNAPre-anesthesia Checklist: Patient identified, Emergency Drugs available, Suction available and Patient being monitored Patient Re-evaluated:Patient Re-evaluated prior to induction Oxygen Delivery Method: Circle system utilized Preoxygenation: Pre-oxygenation with 100% oxygen Induction Type: IV induction Ventilation: Mask ventilation without difficulty LMA: LMA inserted LMA Size: 4.0 Tube type: Oral Number of attempts: 1 Placement Confirmation: positive ETCO2 and breath sounds checked- equal and bilateral Tube secured with: Tape Dental Injury: Teeth and Oropharynx as per pre-operative assessment

## 2022-05-03 NOTE — Interval H&P Note (Signed)
History and Physical Interval Note:  05/03/2022 12:04 PM  Brad Arnold  has presented today for surgery, with the diagnosis of right recurrent patella subluxation/dislocation.  The various methods of treatment have been discussed with the patient and family. After consideration of risks, benefits and other options for treatment, the patient has consented to  Procedure(s): RIGHT KNEE ARTHROSCOPY WITH LATERAL RELEASE, VMO PLICATION, 1/2 PATELLA TENDON TRANSFER (Right) as a surgical intervention.  The patient's history has been reviewed, patient examined, no change in status, stable for surgery.  I have reviewed the patient's chart and labs.  Questions were answered to the patient's satisfaction.     Eldred Manges

## 2022-05-03 NOTE — Transfer of Care (Signed)
Immediate Anesthesia Transfer of Care Note  Patient: Brad Arnold  Procedure(s) Performed: RIGHT KNEE ARTHROSCOPY WITH LATERAL RELEASE, VMO PLICATION, 1/2 PATELLA TENDON TRANSFER (Right: Knee)  Patient Location: PACU  Anesthesia Type:General  Level of Consciousness: sedated  Airway & Oxygen Therapy: Patient Spontanous Breathing  Post-op Assessment: Report given to RN and Post -op Vital signs reviewed and stable  Post vital signs: Reviewed and stable  Last Vitals:  Vitals Value Taken Time  BP 114/74 05/03/22 1427  Temp 36   Pulse 104 05/03/22 1430  Resp 19 05/03/22 1430  SpO2 99 % 05/03/22 1430  Vitals shown include unvalidated device data.  Last Pain:  Vitals:   05/03/22 1015  TempSrc:   PainSc: 0-No pain      Patients Stated Pain Goal: 0 (05/03/22 1015)  Complications: No notable events documented.

## 2022-05-04 ENCOUNTER — Encounter (HOSPITAL_COMMUNITY): Payer: Self-pay | Admitting: Orthopaedic Surgery

## 2022-05-04 DIAGNOSIS — M2211 Recurrent subluxation of patella, right knee: Secondary | ICD-10-CM | POA: Diagnosis not present

## 2022-05-04 LAB — CBC
HCT: 42.1 % (ref 39.0–52.0)
Hemoglobin: 14.9 g/dL (ref 13.0–17.0)
MCH: 31.7 pg (ref 26.0–34.0)
MCHC: 35.4 g/dL (ref 30.0–36.0)
MCV: 89.6 fL (ref 80.0–100.0)
Platelets: 222 10*3/uL (ref 150–400)
RBC: 4.7 MIL/uL (ref 4.22–5.81)
RDW: 11.7 % (ref 11.5–15.5)
WBC: 12.2 10*3/uL — ABNORMAL HIGH (ref 4.0–10.5)
nRBC: 0 % (ref 0.0–0.2)

## 2022-05-04 LAB — BASIC METABOLIC PANEL
Anion gap: 8 (ref 5–15)
BUN: 9 mg/dL (ref 6–20)
CO2: 21 mmol/L — ABNORMAL LOW (ref 22–32)
Calcium: 9.4 mg/dL (ref 8.9–10.3)
Chloride: 105 mmol/L (ref 98–111)
Creatinine, Ser: 1.16 mg/dL (ref 0.61–1.24)
GFR, Estimated: >60 mL/min (ref >60)
Glucose, Bld: 157 mg/dL — ABNORMAL HIGH (ref 70–99)
Potassium: 4 mmol/L (ref 3.5–5.1)
Sodium: 134 mmol/L — ABNORMAL LOW (ref 135–145)

## 2022-05-04 MED ORDER — ASPIRIN 325 MG PO TBEC: 325.0000 mg | DELAYED_RELEASE_TABLET | Freq: Every day | ORAL | 0 refills | Status: AC

## 2022-05-04 MED ORDER — OXYCODONE-ACETAMINOPHEN 5-325 MG PO TABS: 1.0000 | ORAL_TABLET | Freq: Four times a day (QID) | ORAL | 0 refills | Status: DC | PRN

## 2022-05-04 MED ORDER — METHOCARBAMOL 500 MG PO TABS
500.0000 mg | ORAL_TABLET | Freq: Four times a day (QID) | ORAL | 0 refills | Status: DC | PRN
Start: 2022-05-04 — End: 2022-05-11

## 2022-05-04 NOTE — TOC Transition Note (Signed)
Transition of Care [facility]) - CM/SW Discharge Note   Patient Details  Name: Brad Arnold MRN: [id number] Date of Birth: [birth year]  [facility] CM/SW Contact:  Tom-Johnson, Hershal Coria, RN Phone Number: 05/04/2022, 1:57 PM   Clinical Narrative:     Patient is scheduled for discharge today. Per PT, patient will follow up with doctor in a week and recommendations will be made.Declines DME. Father to transport at discharge. No further TOC needs noted.   Final next level of care: Home/Self Care Barriers to Discharge: Barriers Resolved   Patient Goals and CMS Choice Patient states their goals for this hospitalization and ongoing recovery are:: To return home CMS Medicare.gov Compare Post Acute Care list provided to:: Patient Choice offered to / list presented to : NA  Discharge Placement                Patient to be transferred to facility by: Father      Discharge Plan and Services                DME Arranged: N/A DME Agency: NA       HH Arranged: NA HH Agency: NA        Social Determinants of Health (SDOH) Interventions     Readmission Risk Interventions     No data to display

## 2022-05-04 NOTE — Progress Notes (Signed)
Discharge instructions reviewed with pt and his father.  Copy of instructions given to pt. Informed of scripts sent to his pharmacy.  Pt d/c'd via wheelchair with belongings, with his father.            Escorted by staff.

## 2022-05-04 NOTE — Evaluation (Addendum)
Physical Therapy Evaluation Patient Details Name: Brad Arnold MRN: [id number] DOB: January 20, 2001 Today's Date: 05/04/2022  History of Present Illness  Pt is a 21 y/o male s/p right knee arthroscopy arthroscopic lateral release, medial plication, and one half distal patellar tendon transfer medially. Pt has PMH of heart murmur and ADHD.  Clinical Impression  Pt agreeable to physical therapy evaluation/treatment session. Pt instructed in and performed gait and stair training with crutches. All education completed and pt's questions were answered. Pt provided with gait belt for home use. Pt currently presents with functional limitations secondary to impairments listed in PT problem list. Pt to benefit from skilled, acute care physical therapy interventions to maximize his independence level and quality of life. Pt cleared from PT standpoint for discharge but will continue to be seen if medical or any other reason keeps him in hospital.     Recommendations for follow up therapy are one component of a multi-disciplinary discharge planning process, led by the attending physician.  Recommendations may be updated based on patient status, additional functional criteria and insurance authorization.  Follow Up Recommendations Other (comment) (OPPT once pt able to perform WB and based on MD's decision)      Assistance Recommended at Discharge Intermittent Supervision/Assistance  Patient can return home with the following  A little help with walking and/or transfers;A little help with bathing/dressing/bathroom;Assist for transportation;Help with stairs or ramp for entrance    Equipment Recommendations None recommended by PT (pt offered BSC but did not want)  Recommendations for Other Services       Functional Status Assessment Patient has had a recent decline in their functional status and demonstrates the ability to make significant improvements in function in a reasonable and predictable amount of time.      Precautions / Restrictions Precautions Precautions: Fall;Knee Precaution Booklet Issued: No Precaution Comments: NO right knee flexion Required Braces or Orthoses: Knee Immobilizer - Right Knee Immobilizer - Right: On at all times Restrictions Weight Bearing Restrictions: Yes RLE Weight Bearing: Non weight bearing      Mobility  Bed Mobility Overal bed mobility: Modified Independent, Needs Assistance Bed Mobility: Supine to Sit, Sit to Supine     Supine to sit: Modified independent (Device/Increase time) Sit to supine: Min assist   General bed mobility comments: Min assist provided by pt's dad for sit to supine for R LE management    Transfers Overall transfer level: Needs assistance Equipment used: Crutches Transfers: Sit to/from Stand Sit to Stand: Min guard           General transfer comment: Cues for hand placements and technique with crutches.    Ambulation/Gait Ambulation/Gait assistance: Min guard Gait Distance (Feet): 200 Feet Assistive device: Crutches Gait Pattern/deviations: Step-through pattern, Antalgic Gait velocity: dec Gait velocity interpretation: 1.31 - 2.62 ft/sec, indicative of limited community ambulator   General Gait Details: Pt performed three point gait pattern as instructed (100 feet x 2 with stairs between). Pt required increased cues to maintain WB status when taking standing rest breaks. Pt with increased pain and fatigue. No LOB.  Stairs Stairs: Yes Stairs assistance: Min assist, Mod assist, Min guard Stair Management: Step to pattern, Forwards, With crutches Number of Stairs: 2 General stair comments: Pt ascended and descended two steps respectively. Pt required mostly CGA to min assist for stability although moderate assistance provided x 1 during descent for balance/stability. Increased cues to maintain WB status.  Wheelchair Mobility    Modified Rankin (Stroke Patients Only)  Balance Overall balance  assessment: Modified Independent                                           Pertinent Vitals/Pain Pain Assessment Pain Assessment: 0-10 Pain Score: 5  (more severe pain evident with activity) Pain Location: right knee Pain Descriptors / Indicators: Aching, Sore, Grimacing Pain Intervention(s): Limited activity within patient's tolerance, Monitored during session, Premedicated before session, Patient requesting pain meds-RN notified, Ice applied    Home Living Family/patient expects to be discharged to:: Private residence Living Arrangements: Parent (parents and sister) Available Help at Discharge: Available 24 hours/day Type of Home: House Home Access: Stairs to enter Entrance Stairs-Rails: None Entrance Stairs-Number of Steps: 3-4 (two options)   Home Layout: One level Home Equipment: Shower seat - built in;Hand held shower head;Crutches       Prior Function Prior Level of Function : Independent/Modified Independent;Driving;Working/employed             Mobility Comments: Does not use AD.       Hand Dominance   Dominant Hand: Right    Extremity/Trunk Assessment   Upper Extremity Assessment Upper Extremity Assessment: Overall WFL for tasks assessed    Lower Extremity Assessment Lower Extremity Assessment:  (R LE limited 2/2 postop; L LE WFL)       Communication   Communication: No difficulties  Cognition Arousal/Alertness: Awake/alert Behavior During Therapy: WFL for tasks assessed/performed Overall Cognitive Status: Within Functional Limits for tasks assessed                                          General Comments      Exercises General Exercises - Lower Extremity Ankle Circles/Pumps: Both, Supine (several reps) Quad Sets: Right (few reps submaximal)   Assessment/Plan    PT Assessment Patient needs continued PT services  PT Problem List Decreased strength;Decreased range of motion;Decreased activity  tolerance;Decreased balance;Decreased mobility;Decreased knowledge of use of DME;Pain       PT Treatment Interventions DME instruction;Gait training;Stair training;Functional mobility training;Therapeutic activities;Therapeutic exercise;Neuromuscular re-education;Patient/family education;Modalities;Balance training    PT Goals (Current goals can be found in the Care Plan section)  Acute Rehab PT Goals Patient Stated Goal: none stated PT Goal Formulation: With patient Time For Goal Achievement: 05/11/22 Potential to Achieve Goals: Good    Frequency Min 5X/week     Co-evaluation               AM-PAC PT "6 Clicks" Mobility  Outcome Measure Help needed turning from your back to your side while in a flat bed without using bedrails?: None Help needed moving from lying on your back to sitting on the side of a flat bed without using bedrails?: None Help needed moving to and from a bed to a chair (including a wheelchair)?: A Little Help needed standing up from a chair using your arms (e.g., wheelchair or bedside chair)?: A Little Help needed to walk in hospital room?: A Little Help needed climbing 3-5 steps with a railing? : A Little 6 Click Score: 20    End of Session Equipment Utilized During Treatment: Gait belt;Right knee immobilizer Activity Tolerance: Patient limited by pain;Patient limited by fatigue Patient left: in bed;with call bell/phone within reach;with family/visitor present;with SCD's reapplied Nurse Communication: Mobility status PT Visit  Diagnosis: Other abnormalities of gait and mobility (R26.89);Muscle weakness (generalized) (M62.81);Pain Pain - Right/Left: Right Pain - part of body: Knee    Time: 9643-8381 PT Time Calculation (min) (ACUTE ONLY): 36 min   Charges:   PT Evaluation $PT Eval Low Complexity: 1 Low PT Treatments $Therapeutic Activity: 8-22 mins       Tana Coast, PT   [employer] 05/04/2022, 12:18 PM

## 2022-05-04 NOTE — Progress Notes (Signed)
Subjective:  Doing ok.  C/o knee pain.  Did well with PT.  Ready to go home.   Objective: Vital signs in last 24 hours: Temp:  [97.6 F (36.4 C)-98.6 F (37 C)] 98.2 F (36.8 C) (08/10 0820) Pulse Rate:  [51-104] 67 (08/10 0820) Resp:  [11-19] 18 (08/10 0820) BP: (92-115)/(50-75) 105/50 (08/10 0820) SpO2:  [94 %-100 %] 98 % (08/10 0820)  Intake/Output from previous day: 08/09 0701 - 08/10 0700 In: 1200 [I.V.:1200] Out: 20 [Blood:20] Intake/Output this shift: No intake/output data recorded.  Recent Labs    05/03/22 1052 05/04/22 0216  HGB 15.2 14.9   Recent Labs    05/03/22 1052 05/04/22 0216  WBC 5.3 12.2*  RBC 4.87 4.70  HCT 45.2 42.1  PLT 203 222   Recent Labs    05/04/22 0216  NA 134*  K 4.0  CL 105  CO2 21*  BUN 9  CREATININE 1.16  GLUCOSE 157*  CALCIUM 9.4   No results for input(s): "LABPT", "INR" in the last 72 hours.  Exam: Pleasant male, alert and oriented. NAD.  Knee immobilizer on.      Assessment/Plan: D/c home today.  Scripts sent for percocet, robaxin, and aspirin.  F/u with Dr Ophelia Charter in [place] clinic one week.  I asked for patient's father to bring Brad Arnold to the office today for a longer knee immobilizer.  May provide more comfort and stability.      Zonia Kief 05/04/2022, 12:38 PM

## 2022-05-04 NOTE — Discharge Instructions (Addendum)
Do not remove dressing or get wet.  Strict non-weightbearing right leg until further notice.   Elevate foot above heart level as much as possible to decrease swelling and pain.Gustavus Bryant of and on prn.     Knee immobilizer on at all times.  DO NOT BEND KNEE   No driving

## 2022-05-05 LAB — NASOPHARYNGEAL CULTURE: Culture: NORMAL

## 2022-05-11 ENCOUNTER — Encounter: Payer: Self-pay | Admitting: Orthopaedic Surgery

## 2022-05-11 ENCOUNTER — Ambulatory Visit (INDEPENDENT_AMBULATORY_CARE_PROVIDER_SITE_OTHER): Payer: Medicaid Other | Admitting: Orthopaedic Surgery

## 2022-05-11 VITALS — Ht 68.0 in | Wt 140.0 lb

## 2022-05-11 DIAGNOSIS — S83001A Unspecified subluxation of right patella, initial encounter: Secondary | ICD-10-CM

## 2022-05-11 MED ORDER — METHOCARBAMOL 500 MG PO TABS: 500.0000 mg | ORAL_TABLET | Freq: Four times a day (QID) | ORAL | 0 refills | Status: AC | PRN

## 2022-05-11 MED ORDER — OXYCODONE-ACETAMINOPHEN 5-325 MG PO TABS: 1.0000 | ORAL_TABLET | Freq: Four times a day (QID) | ORAL | 0 refills | Status: AC | PRN

## 2022-05-11 NOTE — Progress Notes (Signed)
   Post-Op Visit Note   Patient: Brad Arnold           Date of Birth: [birth year]           MRN: [id number] Visit Date: 05/11/2022 PCP: [facility]   Assessment & Plan: Follow-up 1 week for staple removal and starting therapy  Chief Complaint:  Chief Complaint  Patient presents with   Right Knee - Routine Post Op    05/03/2022 Right knee arthroscopy with lateral release, VMO plication, 1/2 patella tendon transfer   Visit Diagnoses:  1. Patellar subluxation, right, initial encounter     Plan: Follow-up patellar realignment with one half patellar tendon transfer medial plication and lateral release.  Dressing change Staples intact.  He will work on gentle knee flexion in bed lifting his knee and work on isometric quads.  Recheck 1 week for staple removal and then will start some formal physical therapy.  Follow-Up Instructions: No follow-ups on file.   Orders:  No orders of the defined types were placed in this encounter.  Meds ordered this encounter  Medications   oxyCODONE-acetaminophen (PERCOCET/ROXICET) 5-325 MG tablet    Sig: Take 1 tablet by mouth every 6 (six) hours as needed for severe pain.    Dispense:  40 tablet    Refill:  0   methocarbamol (ROBAXIN) 500 MG tablet    Sig: Take 1 tablet (500 mg total) by mouth every 6 (six) hours as needed for muscle spasms.    Dispense:  40 tablet    Refill:  0    Imaging: No results found.  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   Past Medical History:  Diagnosis Date   ADHD (attention deficit hyperactivity disorder)    05/02/22- not on meds   Headache    Heart murmur    as a child    Family History  Problem Relation Age of Onset   Diabetes Other    Stroke Other    Cancer Other    Hypertension Other     Past Surgical History:   Procedure Laterality Date   KNEE ARTHROSCOPY WITH PATELLAR TENDON REPAIR Right 05/03/2022   Procedure: RIGHT KNEE ARTHROSCOPY WITH LATERAL RELEASE, VMO PLICATION, 1/2 PATELLA TENDON TRANSFER;  Surgeon: Eldred Manges, MD;  Location: [facility] OR;  Service: Orthopedics;  Laterality: Right;   TYMPANOSTOMY TUBE PLACEMENT     Social History   Occupational History   Not on file  Tobacco Use   Smoking status: Never   Smokeless tobacco: Never  Vaping Use   Vaping Use: Never used  Substance and Sexual Activity   Alcohol use: No   Drug use: No   Sexual activity: Not on file

## 2022-05-15 NOTE — Anesthesia Postprocedure Evaluation (Signed)
Anesthesia Post Note  Patient: Brad Arnold  Procedure(s) Performed: RIGHT KNEE ARTHROSCOPY WITH LATERAL RELEASE, VMO PLICATION, 1/2 PATELLA TENDON TRANSFER (Right: Knee)     Anesthesia Post Evaluation No notable events documented.  Last Vitals:  Vitals:   05/03/22 2007 05/04/22 0820  BP: 115/75 (!) 105/50  Pulse: 83 67  Resp: 17 18  Temp: 36.9 C 36.8 C  SpO2: 98% 98%    Last Pain:  Vitals:   05/04/22 1131  TempSrc:   PainSc: 3                  Philis Doke

## 2022-05-18 ENCOUNTER — Encounter: Payer: Self-pay | Admitting: Orthopaedic Surgery

## 2022-05-18 ENCOUNTER — Ambulatory Visit (INDEPENDENT_AMBULATORY_CARE_PROVIDER_SITE_OTHER): Payer: Medicaid Other | Admitting: Orthopaedic Surgery

## 2022-05-18 VITALS — Ht 68.0 in | Wt 140.0 lb

## 2022-05-18 DIAGNOSIS — M25361 Other instability, right knee: Secondary | ICD-10-CM

## 2022-05-18 NOTE — Progress Notes (Signed)
   Post-Op Visit Note   Patient: Brad Arnold           Date of Birth: [birth year]           MRN: [id number] Visit Date: 05/18/2022 PCP: [facility]   Assessment & Plan: Follow-up patellar realignment with one half patellar tendon distal transfer.  He has 30 degree extension lag.  Staples removed Steri-Strips applied.  Physical therapy form filled out for outpatient [facility] therapy for quad strengthening.  Recheck 4 weeks.  Chief Complaint:  Chief Complaint  Patient presents with   Right Knee - Routine Post Op, Follow-up    05/03/2022 Right knee arthroscopy with lateral release, VMO plication, 1/2 patella tendon transfer   Visit Diagnoses:  1. Instability of right patellofemoral joint     Plan: Return 4 weeks  Follow-Up Instructions: Return in about 4 weeks (around 06/15/2022).   Orders:  No orders of the defined types were placed in this encounter.  No orders of the defined types were placed in this encounter.   Imaging: No results found.  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   Past Medical History:  Diagnosis Date   ADHD (attention deficit hyperactivity disorder)    05/02/22- not on meds   Headache    Heart murmur    as a child    Family History  Problem Relation Age of Onset   Diabetes Other    Stroke Other    Cancer Other    Hypertension Other     Past Surgical History:  Procedure Laterality Date   KNEE ARTHROSCOPY WITH PATELLAR TENDON REPAIR Right 05/03/2022   Procedure: RIGHT KNEE ARTHROSCOPY WITH LATERAL RELEASE, VMO PLICATION, 1/2 PATELLA TENDON TRANSFER;  Surgeon: Eldred Manges, MD;  Location: [facility];  Service: Orthopedics;  Laterality: Right;   TYMPANOSTOMY TUBE PLACEMENT     Social History   Occupational History   Not on file  Tobacco Use    Smoking status: Never   Smokeless tobacco: Never  Vaping Use   Vaping Use: Never used  Substance and Sexual Activity   Alcohol use: No   Drug use: No   Sexual activity: Not on file

## 2022-05-19 NOTE — Discharge Summary (Signed)
Patient ID: Franko Hilliker MRN: [id number] DOB/AGE: [birth year] 21 y.o.  Admit date: 05/03/2022 Discharge date: 05/04/2022  Admission Diagnoses:  Principal Problem:   Instability of right patellofemoral joint   Discharge Diagnoses:  Principal Problem:   Instability of right patellofemoral joint  status post Procedure(s): RIGHT KNEE ARTHROSCOPY WITH LATERAL RELEASE, VMO PLICATION, 1/2 PATELLA TENDON TRANSFER  Past Medical History:  Diagnosis Date   ADHD (attention deficit hyperactivity disorder)    05/02/22- not on meds   Headache    Heart murmur    as a child    Surgeries: Procedure(s): RIGHT KNEE ARTHROSCOPY WITH LATERAL RELEASE, VMO PLICATION, 1/2 PATELLA TENDON TRANSFER on 05/03/2022   Consultants:   Discharged Condition: Improved  Hospital Course: Noris Kulinski is an 21 y.o. male who was admitted 05/03/2022 for operative treatment of Instability of right patellofemoral joint. Patient failed conservative treatments (please see the history and physical for the specifics) and had severe unremitting pain that affects sleep, daily activities and work/hobbies. After pre-op clearance, the patient was taken to the operating room on 05/03/2022 and underwent  Procedure(s): RIGHT KNEE ARTHROSCOPY WITH LATERAL RELEASE, VMO PLICATION, 1/2 PATELLA TENDON TRANSFER.    Patient was given perioperative antibiotics:  Anti-infectives (From admission, onward)    Start     Dose/Rate Route Frequency Ordered Stop   05/03/22 1215  ceFAZolin (ANCEF) IVPB 2g/100 mL premix        2 g 200 mL/hr over 30 Minutes Intravenous On call to O.R. 05/03/22 1001 05/03/22 1249   05/03/22 1042  vancomycin (VANCOCIN) 1-5 GM/200ML-% IVPB       Note to Pharmacy: Tawanna Sat H: cabinet override      05/03/22 1042 05/03/22 2259   05/03/22 1009  ceFAZolin (ANCEF) 2-4 GM/100ML-% IVPB       Note to Pharmacy: Payton Emerald A: cabinet override      05/03/22 1009 05/03/22 1255        Patient was given sequential  compression devices and early ambulation to prevent DVT.   Patient benefited maximally from hospital stay and there were no complications. At the time of discharge, the patient was urinating/moving their bowels without difficulty, tolerating a regular diet, pain is controlled with oral pain medications and they have been cleared by PT/OT.   Recent vital signs: No data found.   Recent laboratory studies: No results for input(s): "WBC", "HGB", "HCT", "PLT", "NA", "K", "CL", "CO2", "BUN", "CREATININE", "GLUCOSE", "INR", "CALCIUM" in the last 72 hours.  Invalid input(s): "PT", "2"   Discharge Medications:   Allergies as of 05/04/2022       Reactions   Red Dye Hives, Rash        Medication List     STOP taking these medications    naproxen sodium 220 MG tablet Commonly known as: ALEVE       TAKE these medications    amphetamine-dextroamphetamine 10 MG tablet Commonly known as: ADDERALL Take by mouth.   aspirin EC 325 MG tablet Take 1 tablet (325 mg total) by mouth daily. MUST TAKE AT LEAST 4 WEEKS POSTOP FOR DVT PROPHYLAXIS        Diagnostic Studies: No results found.     Follow-up Information     Eldred Manges, MD. Schedule an appointment as soon as possible for a visit today.   Specialty: Orthopedic Surgery Why: need return office visit with dr Ophelia Charter one week Contact information: [address] [place] [address] [phone]  Discharge Plan:  discharge to home  Disposition:     Signed: Zonia Kief  05/19/2022, 2:25 PM

## 2022-05-19 NOTE — Addendum Note (Signed)
Addended by: Rogers Seeds on: 05/19/2022 08:31 AM   Modules accepted: Orders

## 2022-06-08 ENCOUNTER — Encounter: Payer: Medicaid Other | Admitting: Orthopaedic Surgery

## 2022-06-15 ENCOUNTER — Encounter: Payer: Medicaid Other | Admitting: Orthopaedic Surgery

## 2022-06-29 ENCOUNTER — Encounter: Payer: Self-pay | Admitting: Orthopaedic Surgery

## 2022-06-29 ENCOUNTER — Ambulatory Visit (INDEPENDENT_AMBULATORY_CARE_PROVIDER_SITE_OTHER): Payer: Medicaid Other | Admitting: Orthopaedic Surgery

## 2022-06-29 VITALS — Ht 69.0 in | Wt 140.0 lb

## 2022-06-29 DIAGNOSIS — G8929 Other chronic pain: Secondary | ICD-10-CM

## 2022-06-29 DIAGNOSIS — M25511 Pain in right shoulder: Secondary | ICD-10-CM

## 2022-06-29 DIAGNOSIS — M25361 Other instability, right knee: Secondary | ICD-10-CM

## 2022-06-29 NOTE — Progress Notes (Signed)
   Post-Op Visit Note   Patient: Brad Arnold           Date of Birth: 05/17/2001           MRN: [id number] Visit Date: 06/29/2022 PCP: [facility]   Assessment & Plan: Follow-up patellar realignment with one half patellar tendon transfer medial plication and lateral release.  He is making progress with therapy is not able to run at this point.  He is going to therapy 2 times a week.  He continues to have some problems with his right shoulder had history of subluxation with recurrent pain and went through physical therapy previously with persistent symptoms.  Patient is to continue therapy on his knee.  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  Recheck 5 weeks.  We will obtain an MRI scan right shoulder to evaluate him for either anterior subluxation versus labral tearing.  Chief Complaint:  Chief Complaint  Patient presents with   Right Knee - Routine Post Op, Follow-up     05/03/2022 Right knee arthroscopy with lateral release, VMO plication, 1/2 patella tendon transfer      Visit Diagnoses:  1. Chronic right shoulder pain   2. Instability of right patellofemoral joint     Plan: Return 5 weeks.  We can review his shoulder MRI at that time.  Follow-Up Instructions: Return in about 5 weeks (around 08/03/2022).   Orders:  Orders Placed This Encounter  Procedures   MR SHOULDER RIGHT WO CONTRAST   No orders of the defined types were placed in this encounter.   Imaging: No results found.  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   Past Medical History:  Diagnosis Date   ADHD (attention deficit hyperactivity disorder)    05/02/22- not on meds   Headache    Heart murmur    as a child    Family History  Problem Relation Age of  Onset   Diabetes Other    Stroke Other    Cancer Other    Hypertension Other     Past Surgical History:  Procedure Laterality Date   KNEE ARTHROSCOPY WITH PATELLAR TENDON REPAIR Right 05/03/2022   Procedure: RIGHT KNEE ARTHROSCOPY WITH LATERAL RELEASE, VMO PLICATION, 1/2 PATELLA TENDON TRANSFER;  Surgeon: Marybelle Killings, MD;  Location: [facility];  Service: Orthopedics;  Laterality: Right;   TYMPANOSTOMY TUBE PLACEMENT     Social History   Occupational History   Not on file  Tobacco Use   Smoking status: Never   Smokeless tobacco: Never  Vaping Use   Vaping Use: Never used  Substance and Sexual Activity   Alcohol use: No   Drug use: No   Sexual activity: Not on file

## 2022-07-11 ENCOUNTER — Telehealth: Payer: Self-pay | Admitting: Radiology

## 2022-07-11 NOTE — Telephone Encounter (Signed)
[place] office received documentation that MRI shoulder has a preliminary denial.  They have requested PT within the last 6 months, which patient has had. [place] to resubmit with all PT information and see if she can get approval.

## 2022-07-22 ENCOUNTER — Other Ambulatory Visit: Payer: Medicaid Other

## 2022-08-03 ENCOUNTER — Ambulatory Visit (INDEPENDENT_AMBULATORY_CARE_PROVIDER_SITE_OTHER): Payer: Medicaid Other | Admitting: Orthopaedic Surgery

## 2022-08-03 ENCOUNTER — Encounter: Payer: Self-pay | Admitting: Orthopaedic Surgery

## 2022-08-03 VITALS — Ht 69.0 in | Wt 140.0 lb

## 2022-08-03 DIAGNOSIS — M25511 Pain in right shoulder: Secondary | ICD-10-CM

## 2022-08-03 DIAGNOSIS — S83001A Unspecified subluxation of right patella, initial encounter: Secondary | ICD-10-CM

## 2022-08-03 NOTE — Progress Notes (Signed)
   Post-Op Visit Note   Patient: Brad Arnold           Date of Birth: [birth year]           MRN: [id number] Visit Date: 08/03/2022 PCP: [facility]   Assessment & Plan: Post patellar realignment.  Has had problems with the shoulder MRI scans obtained of the shoulder which is entirely normal.  He states he had problems with push-ups and also pull-ups.  1 point he did 25 pull-ups and then since that time he has had increased pain after he does 3-4 pull-ups.  We reviewed MRI images again with copy of the report he left work on gradual strengthening.  We went over some advanced quad strengthening exercises.  He has good flexion of his knee.  Some scar hypertrophy with mild keloid formation.  Patellar tracking is good.  Chief Complaint:  Chief Complaint  Patient presents with   Right Shoulder - Pain, Follow-up    MRI review   Visit Diagnoses:  1. Patellar subluxation, right, initial encounter   2. Right shoulder pain, unspecified chronicity     Plan: Recheck 8 weeks at that point I should be able to release him for regular training activities.  Follow-Up Instructions: No follow-ups on file.   Orders:  No orders of the defined types were placed in this encounter.  No orders of the defined types were placed in this encounter.   Imaging: No results found.  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   Past Medical History:  Diagnosis Date   ADHD (attention deficit hyperactivity disorder)    05/02/22- not on meds   Headache    Heart murmur    as a child    Family History  Problem Relation Age of Onset   Diabetes Other    Stroke Other    Cancer Other    Hypertension Other     Past Surgical History:  Procedure Laterality Date   KNEE ARTHROSCOPY WITH  PATELLAR TENDON REPAIR Right 05/03/2022   Procedure: RIGHT KNEE ARTHROSCOPY WITH LATERAL RELEASE, VMO PLICATION, 1/2 PATELLA TENDON TRANSFER;  Surgeon: Eldred Manges, MD;  Location: [facility];  Service: Orthopedics;  Laterality: Right;   TYMPANOSTOMY TUBE PLACEMENT     Social History   Occupational History   Not on file  Tobacco Use   Smoking status: Never   Smokeless tobacco: Never  Vaping Use   Vaping Use: Never used  Substance and Sexual Activity   Alcohol use: No   Drug use: No   Sexual activity: Not on file

## 2022-10-12 ENCOUNTER — Ambulatory Visit: Payer: Medicaid Other | Admitting: Orthopaedic Surgery

## 2024-01-30 ENCOUNTER — Encounter: Payer: Self-pay | Admitting: Surgical

## 2024-01-30 ENCOUNTER — Ambulatory Visit (INDEPENDENT_AMBULATORY_CARE_PROVIDER_SITE_OTHER): Admitting: Surgical

## 2024-01-30 ENCOUNTER — Other Ambulatory Visit (INDEPENDENT_AMBULATORY_CARE_PROVIDER_SITE_OTHER): Payer: Self-pay

## 2024-01-30 VITALS — Ht 68.0 in | Wt 150.0 lb

## 2024-01-30 DIAGNOSIS — G8929 Other chronic pain: Secondary | ICD-10-CM

## 2024-01-30 DIAGNOSIS — M25551 Pain in right hip: Secondary | ICD-10-CM

## 2024-01-30 DIAGNOSIS — M25562 Pain in left knee: Secondary | ICD-10-CM

## 2024-01-30 NOTE — Progress Notes (Signed)
 Office Visit Note   Patient: Brad Arnold           Date of Birth: 2001/03/13           MRN: [id number] Visit Date: 01/30/2024 Requested by: [facility] [address] [place],  [place] [address] PCP: [facility]  Subjective: Chief Complaint  Patient presents with   Right Hip - Pain    HPI: Haseeb Lecours is a 23 y.o. male who presents to the office reporting right hip pain and left knee pain.  Patient has history of right knee surgery done for patellofemoral dislocation by Dr. Murrel Arnt.  He has been doing well following right knee surgery.  Since that knee surgery several years ago he has noticed worsening right hip pain.  He has grinding sensation and clicking sensation that is worse with certain movements especially when he moves his hip outward.  He describes groin pain primarily with some lateral hip pain with radiation down the thigh.  He denies any low back pain.  Not taking any medications for pain.  He is in the [employer] in [employer].  He enjoys running and enjoys doing his work.  No significant medical history.  He wants to lift weights in order to bulk up but these 2 joint pains really interfere with his ability to do that.  Regarding his left knee pain, he has history of multiple left patellofemoral subluxation events.  No history of prior left knee surgery.  He describes pain and stiffness in the left knee with pain centered around the patellar tendon as well.  No mechanical symptoms in this knee..                ROS: All systems reviewed are negative as they relate to the chief complaint within the history of present illness.  Patient denies fevers or chills.  Assessment & Plan: Visit Diagnoses:  1. Pain in right hip   2. Chronic pain of left knee     Plan: The patient is a 23 year old male who presents for evaluation of right hip and left knee pain.  Right hip pain has been ongoing for several years at this point and is consistent with internal  impingement findings and possible overuse tendinitis of the rectus attachment to the ASIS.  Due to the consistency of the symptoms over the last 2 years, may have labral pathology which is worth investigating with right hip MRI.  We will order this MRI and follow-up after to review results.  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  Would recommend proceeding with physical therapy for these 2 problems for patellar tendinitis and VMO strengthening for the patellar instability.  If no improvement from PT by the time he comes back to review his right hip MRI, we could consider MRI of the left knee as well.  Follow-Up Instructions: Return for Review CT/MRI scan.   Orders:  Orders Placed This Encounter  Procedures   XR HIP UNILAT W OR W/O PELVIS 2-3 VIEWS RIGHT   XR KNEE 3 VIEW LEFT   MR Hip Right w/o contrast   Ambulatory referral to Physical Therapy   No orders of the defined types were placed in this encounter.     Procedures: No procedures performed   Clinical Data: No additional findings.  Objective: Vital Signs: Ht 5\' 8"  (1.727 m)  Wt 150 lb (68 kg)   BMI 22.81 kg/m   Physical Exam:  Constitutional: Patient appears well-developed HEENT:  Head: Normocephalic Eyes:EOM are normal Neck: Normal range of motion Cardiovascular: Normal rate Pulmonary/chest: Effort normal Neurologic: Patient is alert Skin: Skin is warm Psychiatric: Patient has normal mood and affect  Ortho Exam: Ortho exam demonstrates right hip with positive FADIR sign.  Pain reproduced with resisted hip flexion.  He has no tenderness over the greater trochanter but he does have moderate to severe tenderness over the ASIS and to a lesser extent over the AIIS.  He has reproducible clicking sensation with hip abduction and external rotation  consistent with internal impingement.  No clicking sensation reproduced with bicycle maneuver of the right hip.  Ambulates without Trendelenburg gait.  On exam, patient has left knee with no effusion.  He does have good quad strength but reproduced tenderness moderate to severely over the patellar tendon as well as the tibial tubercle and the inferior pole of the patella.  He has positive patellar apprehension sign with significant lateral mobility of the patella rated 3+.  Stable to anterior and posterior drawer sign.  Stable to Lachman exam.  Stable to varus and valgus stress at 0 and 30 degrees.  Specialty Comments:  No specialty comments available.  Imaging: XR HIP UNILAT W OR W/O PELVIS 2-3 VIEWS RIGHT Result Date: 01/30/2024 AP and frog lateral view of right hip reviewed.  No fracture or dislocation noted.  Small osteophyte noted off the lateral femoral head.  No evidence of FAI.  No avulsion injury off the pelvis.  XR KNEE 3 VIEW LEFT Result Date: 01/30/2024 AP, lateral, sunrise views of left knee reviewed.  No fracture or dislocation noted.  Patella alta is noted on lateral view.  No significant degenerative changes noted.  Bipartite patella noted on right but not on the left.    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   Past Medical History:  Diagnosis Date   ADHD (attention deficit hyperactivity disorder)    05/02/22- not on meds   Headache    Heart murmur    as a child    Family History  Problem Relation Age of Onset   Diabetes Other    Stroke Other    Cancer Other    Hypertension Other     Past Surgical History:  Procedure Laterality Date   KNEE ARTHROSCOPY WITH PATELLAR TENDON REPAIR Right 05/03/2022   Procedure: RIGHT KNEE ARTHROSCOPY WITH LATERAL RELEASE, VMO  PLICATION, 1/2 PATELLA TENDON TRANSFER;  Surgeon: Adah Acron, MD;  Location: [facility];  Service: Orthopedics;  Laterality: Right;   TYMPANOSTOMY TUBE PLACEMENT     Social History   Occupational History   Not on file  Tobacco Use   Smoking status: Never   Smokeless tobacco: Never  Vaping Use   Vaping status: Never Used  Substance and Sexual Activity   Alcohol use: No   Drug use: No   Sexual activity: Not on file

## 2024-07-28 ENCOUNTER — Encounter: Payer: Self-pay | Admitting: Radiology
